# Patient Record
Sex: Male | Born: 1980 | Race: Black or African American | Hispanic: No | Marital: Married | Smoking: Never smoker
Health system: Southern US, Community
[De-identification: ages and names within clinical notes are randomized; demographics above are authoritative.]

---

## 2005-11-15 ENCOUNTER — Ambulatory Visit: Payer: Self-pay | Admitting: Internal Medicine

## 2006-02-15 ENCOUNTER — Ambulatory Visit: Payer: Self-pay | Admitting: Internal Medicine

## 2008-04-30 ENCOUNTER — Ambulatory Visit: Payer: Self-pay | Admitting: Internal Medicine

## 2008-04-30 LAB — CONVERTED CEMR LAB
Chlamydia, Swab/Urine, PCR: NEGATIVE
Ketones, urine, test strip: NEGATIVE
Nitrite: NEGATIVE
Urobilinogen, UA: NEGATIVE
WBC Urine, dipstick: NEGATIVE

## 2008-05-04 LAB — CONVERTED CEMR LAB
ALT: 46 units/L (ref 0–53)
AST: 32 units/L (ref 0–37)
BUN: 8 mg/dL (ref 6–23)
Basophils Absolute: 0 10*3/uL (ref 0.0–0.1)
Basophils Relative: 0.9 % (ref 0.0–3.0)
CO2: 31 meq/L (ref 19–32)
Calcium: 9.4 mg/dL (ref 8.4–10.5)
Chloride: 102 meq/L (ref 96–112)
Cholesterol: 224 mg/dL — ABNORMAL HIGH (ref 0–200)
Creatinine, Ser: 0.9 mg/dL (ref 0.4–1.5)
Direct LDL: 171.8 mg/dL
Eosinophils Absolute: 0.1 10*3/uL (ref 0.0–0.7)
Eosinophils Relative: 2.9 % (ref 0.0–5.0)
GFR calc non Af Amer: 129.64 mL/min (ref >60)
Glucose, Bld: 87 mg/dL (ref 70–99)
HCT: 44.4 % (ref 39.0–52.0)
HDL: 30.5 mg/dL — ABNORMAL LOW (ref >39.00)
Hemoglobin: 14.5 g/dL (ref 13.0–17.0)
Lymphocytes Relative: 51.6 % — ABNORMAL HIGH (ref 12.0–46.0)
Lymphs Abs: 1.5 10*3/uL (ref 0.7–4.0)
MCHC: 32.7 g/dL (ref 30.0–36.0)
MCV: 73 fL — ABNORMAL LOW (ref 78.0–100.0)
Monocytes Absolute: 0.4 10*3/uL (ref 0.1–1.0)
Monocytes Relative: 12.2 % — ABNORMAL HIGH (ref 3.0–12.0)
Neutro Abs: 1 10*3/uL — ABNORMAL LOW (ref 1.4–7.7)
Neutrophils Relative %: 32.4 % — ABNORMAL LOW (ref 43.0–77.0)
Platelets: 234 10*3/uL (ref 150.0–400.0)
Potassium: 4.3 meq/L (ref 3.5–5.1)
RBC: 6.09 M/uL — ABNORMAL HIGH (ref 4.22–5.81)
RDW: 13.8 % (ref 11.5–14.6)
Sodium: 139 meq/L (ref 135–145)
TSH: 2.27 microintl units/mL (ref 0.35–5.50)
Total CHOL/HDL Ratio: 7
Triglycerides: 119 mg/dL (ref 0.0–149.0)
VLDL: 23.8 mg/dL (ref 0.0–40.0)
WBC: 3 10*3/uL — ABNORMAL LOW (ref 4.5–10.5)

## 2008-05-05 ENCOUNTER — Telehealth: Payer: Self-pay | Admitting: Internal Medicine

## 2008-05-05 ENCOUNTER — Encounter (INDEPENDENT_AMBULATORY_CARE_PROVIDER_SITE_OTHER): Payer: Self-pay | Admitting: *Deleted

## 2008-05-06 ENCOUNTER — Encounter: Admission: RE | Admit: 2008-05-06 | Discharge: 2008-05-06 | Payer: Self-pay | Admitting: Internal Medicine

## 2008-06-12 ENCOUNTER — Telehealth (INDEPENDENT_AMBULATORY_CARE_PROVIDER_SITE_OTHER): Payer: Self-pay | Admitting: *Deleted

## 2008-06-18 ENCOUNTER — Encounter (INDEPENDENT_AMBULATORY_CARE_PROVIDER_SITE_OTHER): Payer: Self-pay | Admitting: *Deleted

## 2008-07-03 ENCOUNTER — Telehealth (INDEPENDENT_AMBULATORY_CARE_PROVIDER_SITE_OTHER): Payer: Self-pay | Admitting: *Deleted

## 2008-07-16 ENCOUNTER — Ambulatory Visit: Payer: Self-pay | Admitting: Internal Medicine

## 2008-08-07 ENCOUNTER — Telehealth (INDEPENDENT_AMBULATORY_CARE_PROVIDER_SITE_OTHER): Payer: Self-pay | Admitting: *Deleted

## 2008-08-08 ENCOUNTER — Ambulatory Visit: Payer: Self-pay | Admitting: Hematology & Oncology

## 2008-08-08 LAB — CONVERTED CEMR LAB
Basophils Absolute: 0 10*3/uL (ref 0.0–0.1)
Eosinophils Absolute: 0.1 10*3/uL (ref 0.0–0.7)
Lymphocytes Relative: 65.4 % — ABNORMAL HIGH (ref 12.0–46.0)
MCHC: 32.1 g/dL (ref 30.0–36.0)
Neutrophils Relative %: 20.5 % — ABNORMAL LOW (ref 43.0–77.0)
RDW: 13.7 % (ref 11.5–14.6)

## 2008-09-09 ENCOUNTER — Ambulatory Visit: Payer: Self-pay | Admitting: Hematology & Oncology

## 2008-09-10 ENCOUNTER — Encounter: Payer: Self-pay | Admitting: Internal Medicine

## 2008-09-10 LAB — CBC WITH DIFFERENTIAL (CANCER CENTER ONLY)
BASO%: 0.5 % (ref 0.0–2.0)
EOS%: 3.2 % (ref 0.0–7.0)
Eosinophils Absolute: 0.1 10*3/uL (ref 0.0–0.5)
LYMPH%: 58 % — ABNORMAL HIGH (ref 14.0–48.0)
MCH: 22.9 pg — ABNORMAL LOW (ref 28.0–33.4)
MONO%: 5.9 % (ref 0.0–13.0)
NEUT#: 0.9 10*3/uL — ABNORMAL LOW (ref 1.5–6.5)
Platelets: 222 10*3/uL (ref 145–400)
RBC: 5.86 10*6/uL — ABNORMAL HIGH (ref 4.20–5.70)
RDW: 13.9 % (ref 10.5–14.6)

## 2008-09-10 LAB — CHCC SATELLITE - SMEAR

## 2008-09-11 LAB — VITAMIN B12: Vitamin B-12: 249 pg/mL (ref 211–911)

## 2008-12-09 ENCOUNTER — Ambulatory Visit: Payer: Self-pay | Admitting: Hematology & Oncology

## 2008-12-10 ENCOUNTER — Encounter: Payer: Self-pay | Admitting: Internal Medicine

## 2008-12-10 LAB — CBC WITH DIFFERENTIAL (CANCER CENTER ONLY)
BASO#: 0 10*3/uL (ref 0.0–0.2)
EOS%: 3.2 % (ref 0.0–7.0)
HGB: 13.5 g/dL (ref 13.0–17.1)
LYMPH%: 56.9 % — ABNORMAL HIGH (ref 14.0–48.0)
MCH: 23.2 pg — ABNORMAL LOW (ref 28.0–33.4)
MCHC: 33.4 g/dL (ref 32.0–35.9)
MCV: 69 fL — ABNORMAL LOW (ref 82–98)
MONO%: 5.9 % (ref 0.0–13.0)
NEUT#: 0.9 10*3/uL — ABNORMAL LOW (ref 1.5–6.5)
Platelets: 248 10*3/uL (ref 145–400)

## 2009-06-15 ENCOUNTER — Ambulatory Visit: Payer: Self-pay | Admitting: Hematology & Oncology

## 2015-06-06 DIAGNOSIS — H2012 Chronic iridocyclitis, left eye: Secondary | ICD-10-CM | POA: Diagnosis not present

## 2015-07-22 ENCOUNTER — Emergency Department (HOSPITAL_COMMUNITY): Payer: BLUE CROSS/BLUE SHIELD

## 2015-07-22 ENCOUNTER — Encounter (HOSPITAL_COMMUNITY): Payer: Self-pay | Admitting: Family Medicine

## 2015-07-22 ENCOUNTER — Emergency Department (HOSPITAL_COMMUNITY)
Admission: EM | Admit: 2015-07-22 | Discharge: 2015-07-23 | Disposition: A | Discharge status: Home / Self Care | Payer: BLUE CROSS/BLUE SHIELD | Attending: Emergency Medicine | Admitting: Emergency Medicine

## 2015-07-22 DIAGNOSIS — D709 Neutropenia, unspecified: Secondary | ICD-10-CM | POA: Diagnosis not present

## 2015-07-22 DIAGNOSIS — I880 Nonspecific mesenteric lymphadenitis: Secondary | ICD-10-CM | POA: Diagnosis not present

## 2015-07-22 DIAGNOSIS — R1030 Lower abdominal pain, unspecified: Secondary | ICD-10-CM | POA: Diagnosis present

## 2015-07-22 DIAGNOSIS — M549 Dorsalgia, unspecified: Secondary | ICD-10-CM | POA: Diagnosis not present

## 2015-07-22 DIAGNOSIS — R109 Unspecified abdominal pain: Secondary | ICD-10-CM | POA: Diagnosis not present

## 2015-07-22 DIAGNOSIS — M5489 Other dorsalgia: Secondary | ICD-10-CM

## 2015-07-22 LAB — CBC WITH DIFFERENTIAL/PLATELET
BASOS ABS: 0 10*3/uL (ref 0.0–0.1)
Basophils Relative: 1 %
EOS ABS: 0.1 10*3/uL (ref 0.0–0.7)
Eosinophils Relative: 3 %
HEMATOCRIT: 44.7 % (ref 39.0–52.0)
HEMOGLOBIN: 14.1 g/dL (ref 13.0–17.0)
Lymphocytes Relative: 59 %
Lymphs Abs: 1.8 10*3/uL (ref 0.7–4.0)
MCH: 22.7 pg — ABNORMAL LOW (ref 26.0–34.0)
MCHC: 31.5 g/dL (ref 30.0–36.0)
MCV: 72 fL — ABNORMAL LOW (ref 78.0–100.0)
MONOS PCT: 8 %
Monocytes Absolute: 0.2 10*3/uL (ref 0.1–1.0)
NEUTROS PCT: 29 %
Neutro Abs: 0.8 10*3/uL — ABNORMAL LOW (ref 1.7–7.7)
Platelets: 211 10*3/uL (ref 150–400)
RBC: 6.21 MIL/uL — AB (ref 4.22–5.81)
RDW: 15.2 % (ref 11.5–15.5)
WBC: 2.9 10*3/uL — ABNORMAL LOW (ref 4.0–10.5)

## 2015-07-22 LAB — URINALYSIS, ROUTINE W REFLEX MICROSCOPIC
BILIRUBIN URINE: NEGATIVE
GLUCOSE, UA: NEGATIVE mg/dL
HGB URINE DIPSTICK: NEGATIVE
Ketones, ur: NEGATIVE mg/dL
Leukocytes, UA: NEGATIVE
Nitrite: NEGATIVE
PH: 6.5 (ref 5.0–8.0)
Protein, ur: NEGATIVE mg/dL
SPECIFIC GRAVITY, URINE: 1.018 (ref 1.005–1.030)

## 2015-07-22 LAB — BASIC METABOLIC PANEL
Anion gap: 6 (ref 5–15)
BUN: 6 mg/dL (ref 6–20)
CHLORIDE: 104 mmol/L (ref 101–111)
CO2: 25 mmol/L (ref 22–32)
CREATININE: 0.76 mg/dL (ref 0.61–1.24)
Calcium: 9.4 mg/dL (ref 8.9–10.3)
GFR calc Af Amer: >60 mL/min (ref >60)
GFR calc non Af Amer: >60 mL/min (ref >60)
GLUCOSE: 93 mg/dL (ref 65–99)
Potassium: 3.8 mmol/L (ref 3.5–5.1)
Sodium: 135 mmol/L (ref 135–145)

## 2015-07-22 MED ORDER — CYCLOBENZAPRINE HCL 10 MG PO TABS: 10.0000 mg | ORAL_TABLET | Freq: Three times a day (TID) | ORAL | Status: AC | PRN

## 2015-07-22 MED ORDER — IBUPROFEN 200 MG PO TABS
600.0000 mg | ORAL_TABLET | Freq: Once | ORAL | Status: AC
  Administered 2015-07-22: 600 mg via ORAL
  Filled 2015-07-22: qty 3

## 2015-07-22 MED ORDER — NAPROXEN 500 MG PO TABS: 500.0000 mg | ORAL_TABLET | Freq: Two times a day (BID) | ORAL | Status: AC

## 2015-07-22 NOTE — ED Notes (Signed)
Per pt about 2 saturdays ago he was running down a hill and pulled muscle in lower back. sts then the pain began to radiate into groin and testicles. sts sharp pain.

## 2015-07-22 NOTE — Discharge Instructions (Signed)
Follow up with your primary care doctor. You will need a CT scan in 3-6 months to make sure the lymph node swelling goes down.

## 2015-07-22 NOTE — ED Provider Notes (Signed)
CSN: [id number]     Arrival date & time 07/22/15  0719 History   First MD Initiated Contact with Patient 07/22/15 [phone]     Chief Complaint  Patient presents with  . Groin Pain     (Consider location/radiation/quality/duration/timing/severity/associated sxs/prior Treatment) HPI  35 year old male with right-sided flank pain for the past 10 days. He states his started the day after he ran down a hill faster than he wanted chasing a mask while. He thought he possibly pulled a muscle in his back. Has had intermittent pain since. Occasionally the pain shoots into his right testicle. Denies hematuria or dysuria. Has tried different stretching of exercises but no pain medicine. However this morning the pain was much more severe. Currently at rest it is about a 5/10. Certain movements make it worse. No abdominal pain, nausea or vomiting.  History reviewed. No pertinent past medical history. History reviewed. No pertinent past surgical history. History reviewed. No pertinent family history. Social History  Substance Use Topics  . Smoking status: Never Smoker   . Smokeless tobacco: None  . Alcohol Use: None    Review of Systems  Constitutional: Negative for fever.  Gastrointestinal: Negative for nausea, vomiting, abdominal pain and diarrhea.  Genitourinary: Negative for dysuria and hematuria.  Musculoskeletal: Positive for back pain.  All other systems reviewed and are negative.     Allergies  Review of patient's allergies indicates no known allergies.  Home Medications   Prior to Admission medications   Not on File   BP 134/94 mmHg  Pulse 69  Temp(Src) 98.2 F (36.8 C) (Oral)  Resp 16  SpO2 98% Physical Exam  Constitutional: He is oriented to person, place, and time. He appears well-developed and well-nourished.  HENT:  Head: Normocephalic and atraumatic.  Right Ear: External ear normal.  Left Ear: External ear normal.  Nose: Nose normal.  Eyes: Right eye exhibits no  discharge. Left eye exhibits no discharge.  Neck: Neck supple.  Cardiovascular: Normal rate, regular rhythm, normal heart sounds and intact distal pulses.   Pulmonary/Chest: Effort normal and breath sounds normal.  Abdominal: Soft. He exhibits no distension. There is no tenderness. Hernia confirmed negative in the right inguinal area.  No flank tenderness or CVA tenderness  Genitourinary: Penis normal. Right testis shows no mass, no swelling and no tenderness. Left testis shows no mass, no swelling and no tenderness.  Musculoskeletal: He exhibits no edema.  No thoracolumbar tenderness  Neurological: He is alert and oriented to person, place, and time.  Skin: Skin is warm and dry.  Nursing note and vitals reviewed.   ED Course  Procedures (including critical care time) Labs Review Labs Reviewed  CBC WITH DIFFERENTIAL/PLATELET - Abnormal; Notable for the following:    WBC 2.9 (*)    RBC 6.21 (*)    MCV 72.0 (*)    MCH 22.7 (*)    Neutro Abs 0.8 (*)    All other components within normal limits  URINALYSIS, ROUTINE W REFLEX MICROSCOPIC (NOT AT [facility])  BASIC METABOLIC PANEL    Imaging Review Ct Renal Stone Study  07/22/2015  CLINICAL DATA:  Right flank pain for 2 weeks. EXAM: CT ABDOMEN AND PELVIS WITHOUT CONTRAST TECHNIQUE: Multidetector CT imaging of the abdomen and pelvis was performed following the standard protocol without IV contrast. COMPARISON:  None. FINDINGS: Visualized lung bases are unremarkable. No significant osseous abnormality is noted. No gallstones are noted. No focal abnormality is noted in the liver, spleen or pancreas on these unenhanced images.  Adrenal glands and kidneys appear normal. No hydronephrosis or renal obstruction is noted. No renal or ureteral calculi are noted. Moderate size fat containing periumbilical hernia is noted. There is no evidence of bowel obstruction. No abnormal fluid collection is noted. Urinary bladder appears normal. Mildly enlarged mesenteric  lymph nodes are noted in the right lower quadrant, with the largest measuring 2.5 x 1.9 x 1.3 cm. IMPRESSION: No hydronephrosis or renal obstruction is noted. No renal or ureteral calculi are noted. Moderate size fat containing periumbilical hernia is noted. Mildly enlarged mesenteric lymph nodes are noted in the right lower quadrant, with the largest lymph node measuring 1.3 cm in minor axis. This may simply represent mesenteric adenitis, but correlation with clinical and laboratory data is recommended to evaluate for possible lymphoproliferative disorder. Electronically Signed   By: Lupita Raider, M.D.   On: 07/22/2015 09:38   I have personally reviewed and evaluated these images and lab results as part of my medical decision-making.   EKG Interpretation None      MDM   Final diagnoses:  Right-sided back pain, unspecified location  Mesenteric adenitis    Unclear etiology for patient's right flank/back pain. Pain does sometimes radiate into his testicles but is GU exam is normal. He denies any discharge or dysuria. Swelling or tenderness. CT shows no obvious renal stone. That show enlarged lymph nodes, WBC is low but is similar to 7 years ago. Discussed CBC with Dr. Bertis Ruddy who says most likely reason is a normal variant due to patient being African-American. Patient states he was told this by another oncologist as well. He will need follow-up with his PCP for repeat imaging per oncology to make sure the lymph nodes improved. Otherwise likely he has muscular pain. Treat with NSAIDs and muscle relaxers. Discussed return precautions and need for follow-up.    Pricilla Loveless, MD 07/22/15 1153

## 2015-07-22 NOTE — ED Notes (Signed)
Pt ambulated to room from waiting room. 

## 2015-07-23 LAB — PATHOLOGIST SMEAR REVIEW

## 2015-08-03 DIAGNOSIS — M545 Low back pain: Secondary | ICD-10-CM | POA: Diagnosis not present

## 2015-08-14 DIAGNOSIS — N390 Urinary tract infection, site not specified: Secondary | ICD-10-CM | POA: Diagnosis not present

## 2017-03-30 DIAGNOSIS — D649 Anemia, unspecified: Secondary | ICD-10-CM | POA: Diagnosis not present

## 2017-03-30 DIAGNOSIS — Z13220 Encounter for screening for lipoid disorders: Secondary | ICD-10-CM | POA: Diagnosis not present

## 2017-03-30 DIAGNOSIS — Z0000 Encounter for general adult medical examination without abnormal findings: Secondary | ICD-10-CM | POA: Diagnosis not present

## 2017-03-30 DIAGNOSIS — Z23 Encounter for immunization: Secondary | ICD-10-CM | POA: Diagnosis not present

## 2017-07-27 DIAGNOSIS — E611 Iron deficiency: Secondary | ICD-10-CM | POA: Diagnosis not present

## 2017-12-04 IMAGING — CT CT RENAL STONE PROTOCOL
2 of 3 series · 11 of 46 positions shown, 12 images · non-contrast
Comparison: None.

CLINICAL DATA: Right flank pain for 2 weeks.

EXAM:
CT ABDOMEN AND PELVIS WITHOUT CONTRAST
TECHNIQUE: Multidetector CT imaging of the abdomen and pelvis was performed
following the standard protocol without IV contrast.

[Series 201: stone study, idose (2) · axial · 0.71mm/px · z∈[+307,+727]mm · 8 of 98 slices shown, 9 images]
[im 7/98  soft-tissue]
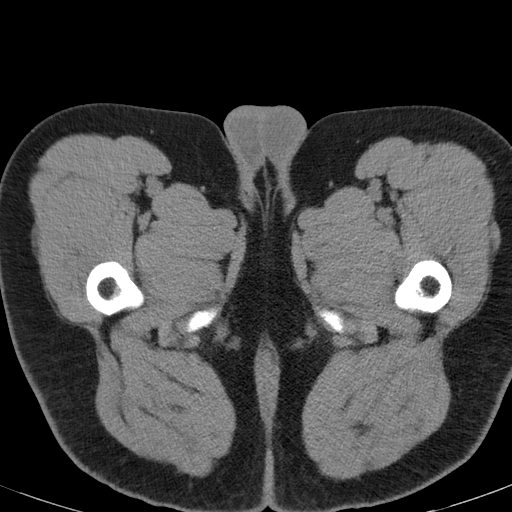
[im 7/98  bone]
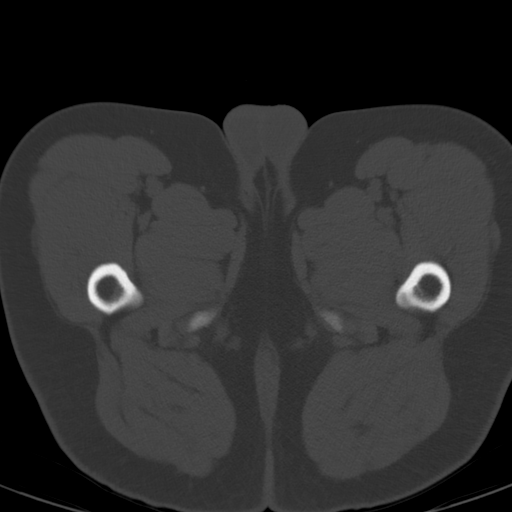
[im 19/98  soft-tissue]
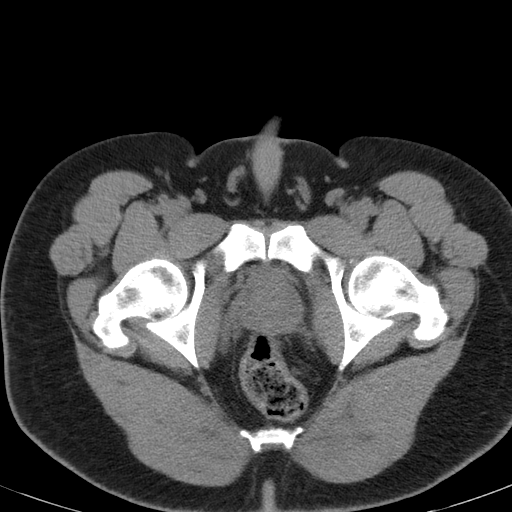
[im 32/98  soft-tissue]
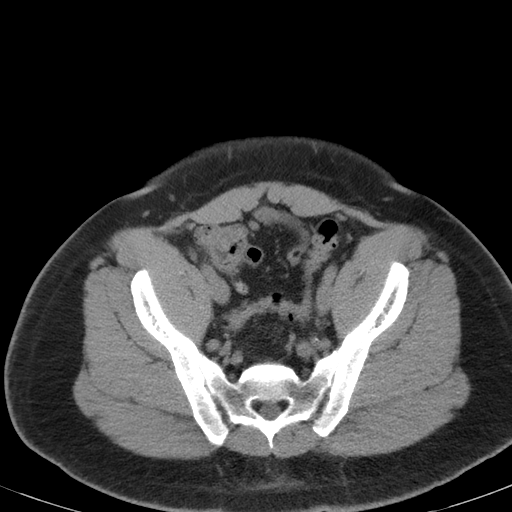
[im 44/98  soft-tissue]
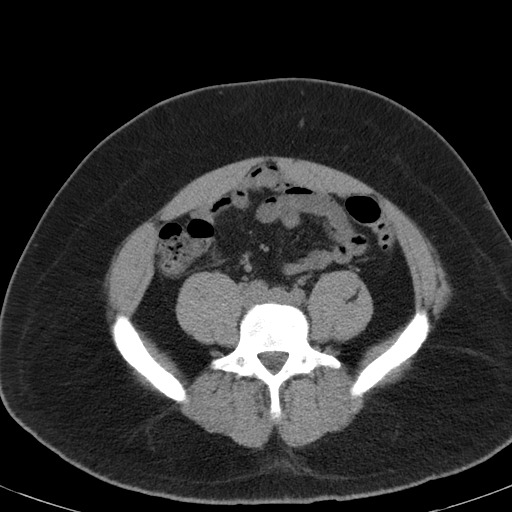
[im 54/98  soft-tissue]
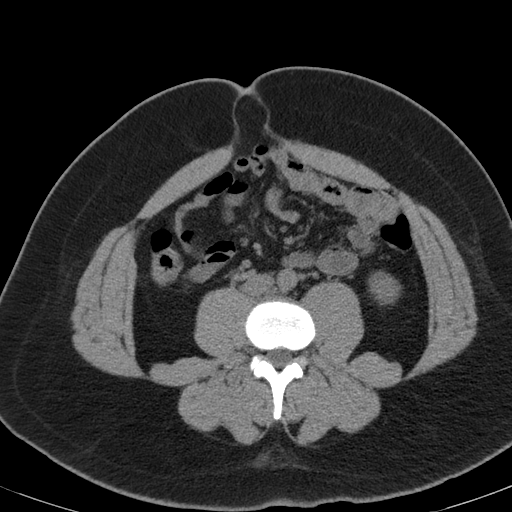
[im 66/98  soft-tissue]
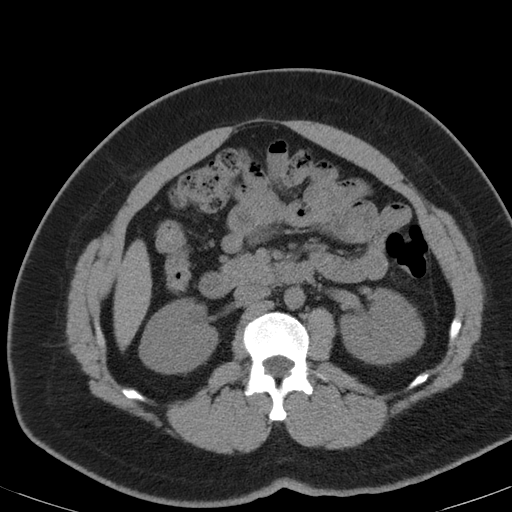
[im 79/98  soft-tissue]
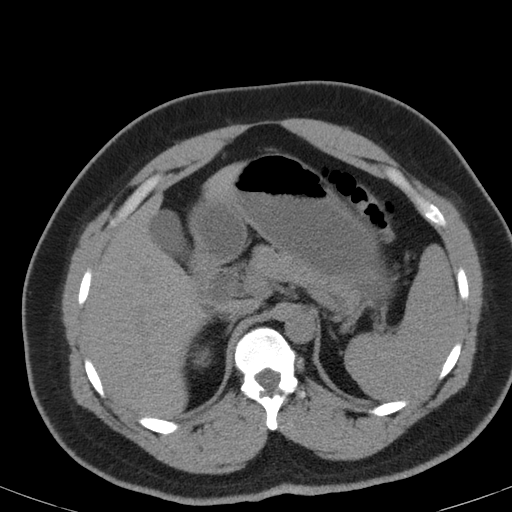
[im 91/98  soft-tissue]
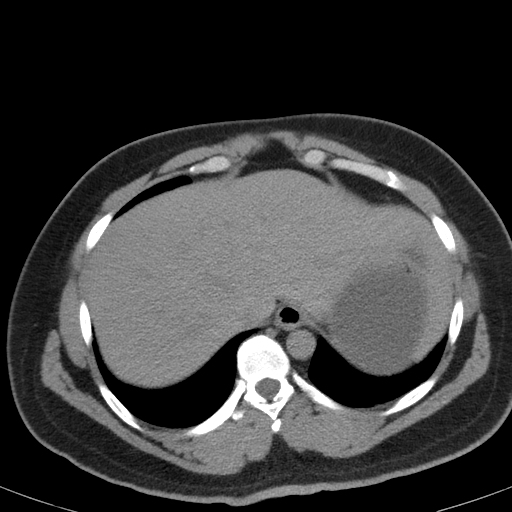

[Series 203: coronals, idose (2) · coronal · 0.45mm/px · 3 of 130 slices shown]
[im 44/130  soft-tissue]
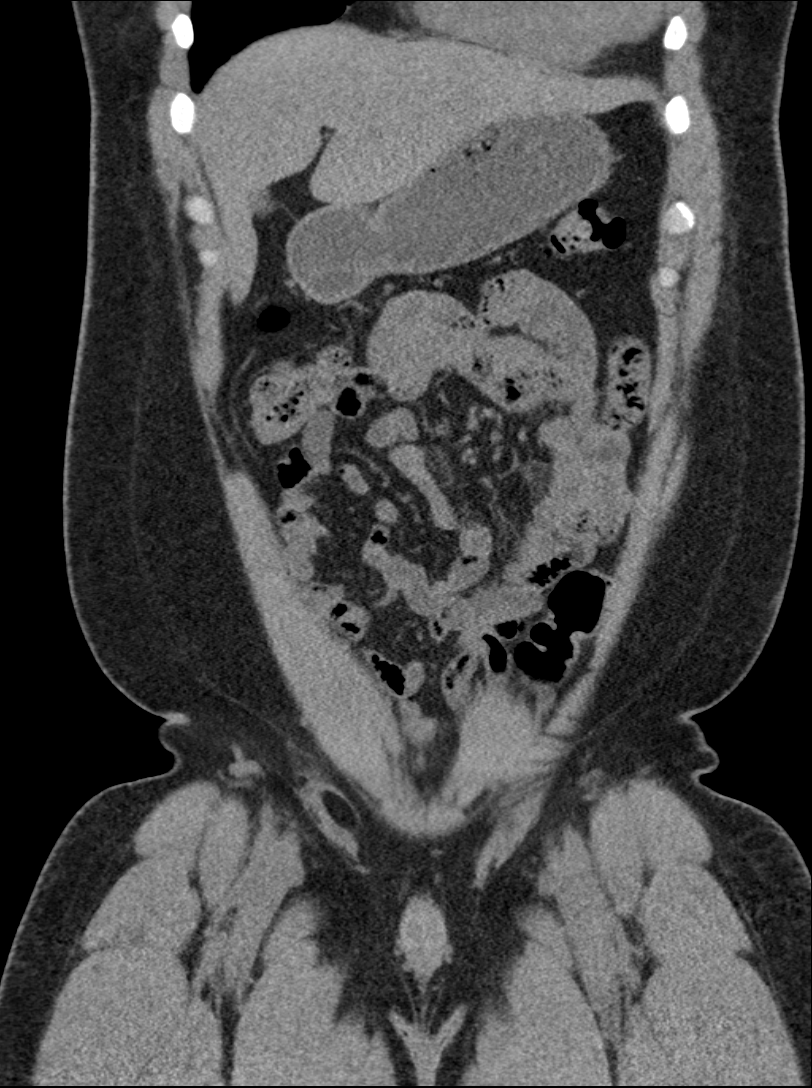
[im 58/130  soft-tissue]
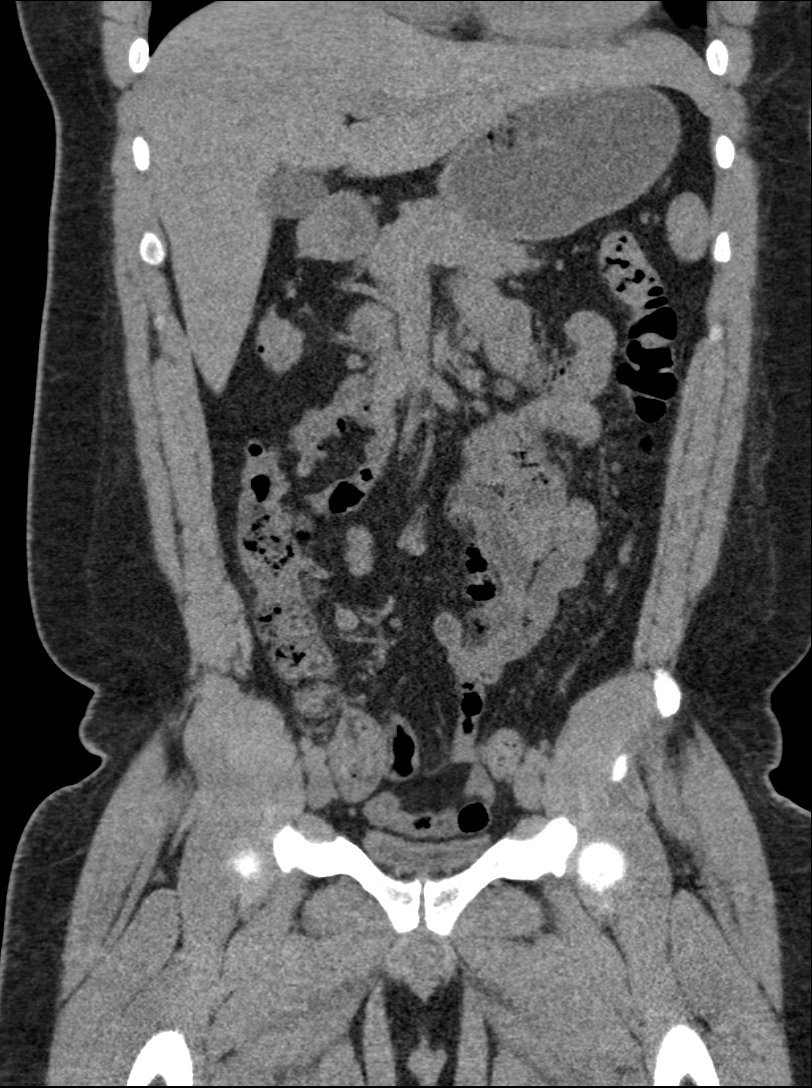
[im 72/130  soft-tissue]
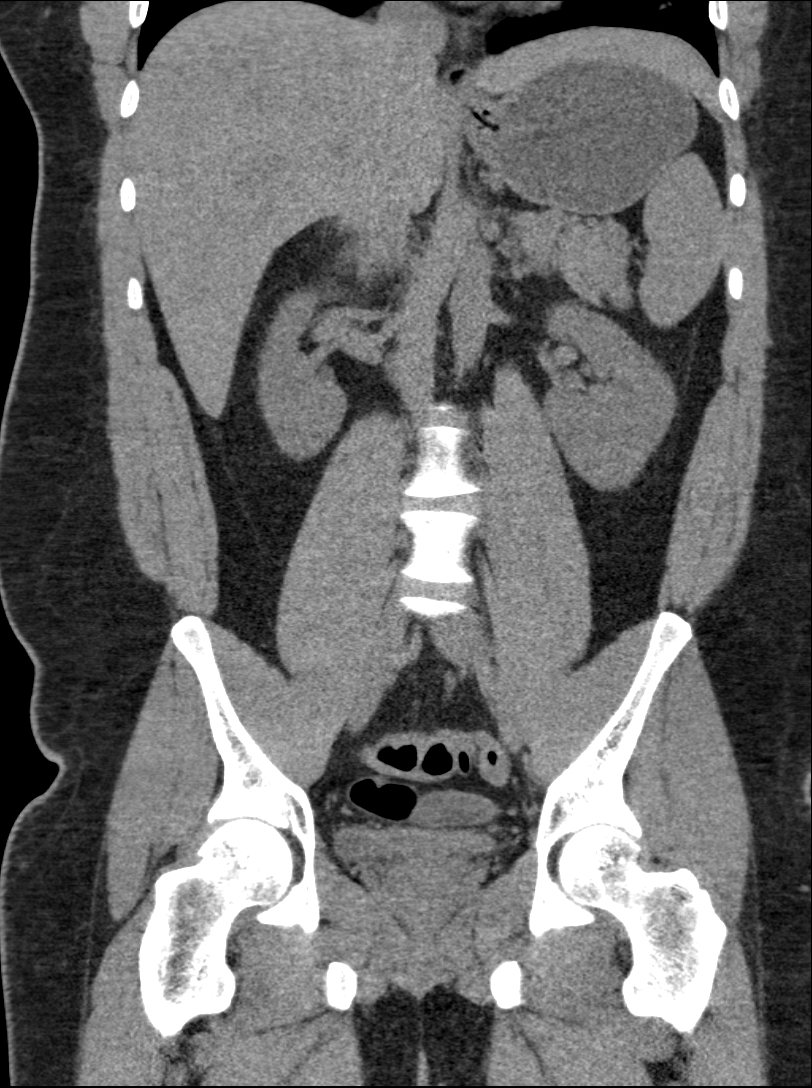

[11 of 46 positions shown; findings below may reference images not displayed]

FINDINGS: Visualized lung bases are unremarkable. No significant osseous
abnormality is noted.

No gallstones are noted. No focal abnormality is noted in the liver,
spleen or pancreas on these unenhanced images. Adrenal glands and
kidneys appear normal. No hydronephrosis or renal obstruction is
noted. No renal or ureteral calculi are noted. Moderate size fat
containing periumbilical hernia is noted. There is no evidence of
bowel obstruction. No abnormal fluid collection is noted. Urinary
bladder appears normal. Mildly enlarged mesenteric lymph nodes are
noted in the right lower quadrant, with the largest measuring 2.5 x
1.9 x 1.3 cm.
IMPRESSION: No hydronephrosis or renal obstruction is noted. No renal or
ureteral calculi are noted.

Moderate size fat containing periumbilical hernia is noted.

Mildly enlarged mesenteric lymph nodes are noted in the right lower
quadrant, with the largest lymph node measuring 1.3 cm in minor
axis. This may simply represent mesenteric adenitis, but correlation
with clinical and laboratory data is recommended to evaluate for
possible lymphoproliferative disorder.

## 2018-04-03 DIAGNOSIS — R079 Chest pain, unspecified: Secondary | ICD-10-CM | POA: Diagnosis not present

## 2018-04-03 DIAGNOSIS — Z0000 Encounter for general adult medical examination without abnormal findings: Secondary | ICD-10-CM | POA: Diagnosis not present

## 2018-04-03 DIAGNOSIS — Z8249 Family history of ischemic heart disease and other diseases of the circulatory system: Secondary | ICD-10-CM | POA: Diagnosis not present

## 2018-04-03 DIAGNOSIS — D72819 Decreased white blood cell count, unspecified: Secondary | ICD-10-CM | POA: Diagnosis not present

## 2018-04-03 DIAGNOSIS — D649 Anemia, unspecified: Secondary | ICD-10-CM | POA: Diagnosis not present

## 2018-04-03 DIAGNOSIS — Z13220 Encounter for screening for lipoid disorders: Secondary | ICD-10-CM | POA: Diagnosis not present

## 2018-04-05 ENCOUNTER — Telehealth: Payer: Self-pay | Admitting: Hematology & Oncology

## 2018-04-05 NOTE — Telephone Encounter (Signed)
Printed and faxed medical record from [facility] sent to [facility] [place] [phone]

## 2018-12-14 DIAGNOSIS — L723 Sebaceous cyst: Secondary | ICD-10-CM | POA: Diagnosis not present

## 2019-02-15 ENCOUNTER — Emergency Department (HOSPITAL_COMMUNITY): Payer: BC Managed Care – PPO

## 2019-02-15 ENCOUNTER — Encounter (HOSPITAL_COMMUNITY): Payer: Self-pay | Admitting: Emergency Medicine

## 2019-02-15 ENCOUNTER — Emergency Department (HOSPITAL_COMMUNITY)
Admission: EM | Admit: 2019-02-15 | Discharge: 2019-02-15 | Disposition: A | Discharge status: Home / Self Care | Payer: BC Managed Care – PPO | Attending: Emergency Medicine | Admitting: Emergency Medicine

## 2019-02-15 ENCOUNTER — Other Ambulatory Visit: Payer: Self-pay

## 2019-02-15 DIAGNOSIS — R079 Chest pain, unspecified: Secondary | ICD-10-CM | POA: Diagnosis not present

## 2019-02-15 DIAGNOSIS — Z79899 Other long term (current) drug therapy: Secondary | ICD-10-CM | POA: Insufficient documentation

## 2019-02-15 DIAGNOSIS — R030 Elevated blood-pressure reading, without diagnosis of hypertension: Secondary | ICD-10-CM

## 2019-02-15 LAB — BASIC METABOLIC PANEL
Anion gap: 10 (ref 5–15)
BUN: 10 mg/dL (ref 6–20)
CO2: 23 mmol/L (ref 22–32)
Calcium: 9.2 mg/dL (ref 8.9–10.3)
Chloride: 102 mmol/L (ref 98–111)
Creatinine, Ser: 0.95 mg/dL (ref 0.61–1.24)
GFR calc Af Amer: >60 mL/min (ref >60)
GFR calc non Af Amer: >60 mL/min (ref >60)
Glucose, Bld: 101 mg/dL — ABNORMAL HIGH (ref 70–99)
Potassium: 3.6 mmol/L (ref 3.5–5.1)
Sodium: 135 mmol/L (ref 135–145)

## 2019-02-15 LAB — CBC
HCT: 44.8 % (ref 39.0–52.0)
Hemoglobin: 13.5 g/dL (ref 13.0–17.0)
MCH: 22.7 pg — ABNORMAL LOW (ref 26.0–34.0)
MCHC: 30.1 g/dL (ref 30.0–36.0)
MCV: 75.2 fL — ABNORMAL LOW (ref 80.0–100.0)
Platelets: 279 10*3/uL (ref 150–400)
RBC: 5.96 MIL/uL — ABNORMAL HIGH (ref 4.22–5.81)
RDW: 15.9 % — ABNORMAL HIGH (ref 11.5–15.5)
WBC: 3.8 10*3/uL — ABNORMAL LOW (ref 4.0–10.5)
nRBC: 0 % (ref 0.0–0.2)

## 2019-02-15 LAB — PROTIME-INR
INR: 1 (ref 0.8–1.2)
Prothrombin Time: 13.1 seconds (ref 11.4–15.2)

## 2019-02-15 LAB — TROPONIN I (HIGH SENSITIVITY)
Troponin I (High Sensitivity): 3 ng/L (ref <18)
Troponin I (High Sensitivity): 3 ng/L (ref <18)

## 2019-02-15 MED ORDER — SODIUM CHLORIDE 0.9% FLUSH: 3.0000 mL | Freq: Once | INTRAVENOUS | Status: DC

## 2019-02-15 NOTE — ED Provider Notes (Signed)
[place] EMERGENCY DEPARTMENT Provider Note   CSN: [id number] Arrival date & time: 02/15/19  0025   History Chief Complaint  Patient presents with  . Chest Pain    Allen Riley is a 39 y.o. male.  The history is provided by the patient.  Chest Pain He had onset about 4 PM with a tense feeling in the left side of his chest.  It seemed to be worse if he moves in certain ways.  It was mild and he rates his discomfort at 4/10.  There is no associated dyspnea, nausea, diaphoresis.  The tense feeling is gradually subsided and is now gone.  He is a non-smoker and denies history of hypertension or diabetes or hyperlipidemia.  He does have family history of premature coronary atherosclerosis.  History reviewed. No pertinent past medical history.  There are no problems to display for this patient.   History reviewed. No pertinent surgical history.     No family history on file.  Social History   Tobacco Use  . Smoking status: Never Smoker  . Smokeless tobacco: Never Used  Substance Use Topics  . Alcohol use: Never  . Drug use: Never    Home Medications Prior to Admission medications   Medication Sig Start Date End Date Taking? Authorizing Provider  cyclobenzaprine (FLEXERIL) 10 MG tablet Take 1 tablet (10 mg total) by mouth 3 (three) times daily as needed for muscle spasms. 07/22/15   Sherwood Gambler, MD  naproxen (NAPROSYN) 500 MG tablet Take 1 tablet (500 mg total) by mouth 2 (two) times daily with a meal. 07/22/15   Sherwood Gambler, MD    Allergies    Patient has no known allergies.  Review of Systems   Review of Systems  Cardiovascular: Positive for chest pain.  All other systems reviewed and are negative.   Physical Exam Updated Vital Signs BP 123/81   Pulse 67   Temp 98.2 F (36.8 C) (Oral)   Resp 16   SpO2 96%   Physical Exam Vitals and nursing note reviewed.   39 year old male, resting comfortably and in no acute distress. Vital  signs are normal. Oxygen saturation is 96%, which is normal. Head is normocephalic and atraumatic. PERRLA, EOMI. Oropharynx is clear. Neck is nontender and supple without adenopathy or JVD. Back is nontender and there is no CVA tenderness. Lungs are clear without rales, wheezes, or rhonchi. Chest is nontender. Heart has regular rate and rhythm without murmur. Abdomen is soft, flat, nontender without masses or hepatosplenomegaly and peristalsis is normoactive. Extremities have no cyanosis or edema, full range of motion is present. Skin is warm and dry without rash. Neurologic: Mental status is normal, cranial nerves are intact, there are no motor or sensory deficits.  ED Results / Procedures / Treatments   Labs (all labs ordered are listed, but only abnormal results are displayed) Labs Reviewed  BASIC METABOLIC PANEL - Abnormal; Notable for the following components:      Result Value   Glucose, Bld 101 (*)    All other components within normal limits  CBC - Abnormal; Notable for the following components:   WBC 3.8 (*)    RBC 5.96 (*)    MCV 75.2 (*)    MCH 22.7 (*)    RDW 15.9 (*)    All other components within normal limits  PROTIME-INR  TROPONIN I (HIGH SENSITIVITY)  TROPONIN I (HIGH SENSITIVITY)    EKG EKG Interpretation  Date/Time:  Friday February 15 2019 00:32:39 EST Ventricular Rate:  89 PR Interval:  150 QRS Duration: 88 QT Interval:  362 QTC Calculation: 440 R Axis:   76 Text Interpretation: Normal sinus rhythm Normal ECG No old tracing to compare Confirmed by Dione Booze ([id number]) on 02/15/2019 2:16:05 AM   Radiology DG Chest 2 View  Result Date: 02/15/2019 CLINICAL DATA:  Chest pain, no shortness of breath. EXAM: CHEST - 2 VIEW COMPARISON:  None. FINDINGS: No consolidation, features of edema, pneumothorax, or effusion. Pulmonary vascularity is normally distributed. The cardiomediastinal contours are unremarkable. No acute osseous or soft tissue abnormality.  IMPRESSION: No acute cardiopulmonary abnormality. Electronically Signed   By: Kreg Shropshire M.D.   On: 02/15/2019 01:03    Procedures Procedures  Medications Ordered in ED Medications  sodium chloride flush (NS) 0.9 % injection 3 mL (has no administration in time range)    ED Course  I have reviewed the triage vital signs and the nursing notes.  Pertinent labs & imaging results that were available during my care of the patient were reviewed by me and considered in my medical decision making (see chart for details).  MDM Rules/Calculators/A&P Chest pain which is somewhat atypical.  ECG is normal and troponin is normal x2.  Chest x-ray is normal.  Blood pressures have been generally elevated, this will need to be followed as an outpatient.  Old records are reviewed, and he has no relevant prior visits.  He is reassured that his chest pain does not appear to be cardiac, but recommended he follow-up with his PCP regarding his blood pressure.  Return precautions discussed.  Final Clinical Impression(s) / ED Diagnoses Final diagnoses:  Nonspecific chest pain  Elevated blood pressure reading without diagnosis of hypertension    Rx / DC Orders ED Discharge Orders    None       Dione Booze, MD 02/15/19 [phone]

## 2019-02-15 NOTE — Discharge Instructions (Signed)
Take acetaminophen, ibuprofen, or naproxen as needed for pain.  Your blood pressure was a little high today.  Please have it checked several times over the next 2 weeks.  If it stays consistently high, you may need to be on medication to control it.

## 2019-02-15 NOTE — ED Triage Notes (Signed)
Patient reports left chest pain onset yesterday afternoon , denies SOB , no fever or chills , no emesis or diaphoresis.

## 2019-07-19 DIAGNOSIS — Z13220 Encounter for screening for lipoid disorders: Secondary | ICD-10-CM | POA: Diagnosis not present

## 2019-07-19 DIAGNOSIS — Z0000 Encounter for general adult medical examination without abnormal findings: Secondary | ICD-10-CM | POA: Diagnosis not present

## 2020-05-11 DIAGNOSIS — H20021 Recurrent acute iridocyclitis, right eye: Secondary | ICD-10-CM | POA: Diagnosis not present

## 2020-05-28 DIAGNOSIS — H2011 Chronic iridocyclitis, right eye: Secondary | ICD-10-CM | POA: Diagnosis not present

## 2020-06-10 DIAGNOSIS — H2011 Chronic iridocyclitis, right eye: Secondary | ICD-10-CM | POA: Diagnosis not present

## 2020-07-08 DIAGNOSIS — H2011 Chronic iridocyclitis, right eye: Secondary | ICD-10-CM | POA: Diagnosis not present

## 2020-07-29 DIAGNOSIS — D72819 Decreased white blood cell count, unspecified: Secondary | ICD-10-CM | POA: Diagnosis not present

## 2020-07-29 DIAGNOSIS — Z13220 Encounter for screening for lipoid disorders: Secondary | ICD-10-CM | POA: Diagnosis not present

## 2020-07-29 DIAGNOSIS — Z0000 Encounter for general adult medical examination without abnormal findings: Secondary | ICD-10-CM | POA: Diagnosis not present

## 2020-08-12 DIAGNOSIS — H2011 Chronic iridocyclitis, right eye: Secondary | ICD-10-CM | POA: Diagnosis not present

## 2020-12-04 DIAGNOSIS — M545 Low back pain, unspecified: Secondary | ICD-10-CM | POA: Diagnosis not present

## 2020-12-04 DIAGNOSIS — M79604 Pain in right leg: Secondary | ICD-10-CM | POA: Diagnosis not present

## 2021-09-13 DIAGNOSIS — Z0000 Encounter for general adult medical examination without abnormal findings: Secondary | ICD-10-CM | POA: Diagnosis not present

## 2021-09-13 DIAGNOSIS — Z130 Encounter for screening for diseases of the blood and blood-forming organs and certain disorders involving the immune mechanism: Secondary | ICD-10-CM | POA: Diagnosis not present

## 2022-09-22 DIAGNOSIS — Z125 Encounter for screening for malignant neoplasm of prostate: Secondary | ICD-10-CM | POA: Diagnosis not present

## 2022-09-22 DIAGNOSIS — Z13220 Encounter for screening for lipoid disorders: Secondary | ICD-10-CM | POA: Diagnosis not present

## 2022-09-22 DIAGNOSIS — Z0000 Encounter for general adult medical examination without abnormal findings: Secondary | ICD-10-CM | POA: Diagnosis not present

## 2022-09-22 DIAGNOSIS — I10 Essential (primary) hypertension: Secondary | ICD-10-CM | POA: Diagnosis not present

## 2022-09-22 DIAGNOSIS — Z131 Encounter for screening for diabetes mellitus: Secondary | ICD-10-CM | POA: Diagnosis not present

## 2022-12-23 DIAGNOSIS — E038 Other specified hypothyroidism: Secondary | ICD-10-CM | POA: Diagnosis not present

## 2022-12-23 DIAGNOSIS — E119 Type 2 diabetes mellitus without complications: Secondary | ICD-10-CM | POA: Diagnosis not present

## 2023-06-30 DIAGNOSIS — E119 Type 2 diabetes mellitus without complications: Secondary | ICD-10-CM | POA: Diagnosis not present

## 2023-06-30 DIAGNOSIS — I10 Essential (primary) hypertension: Secondary | ICD-10-CM | POA: Diagnosis not present

## 2023-08-21 DIAGNOSIS — Z6836 Body mass index (BMI) 36.0-36.9, adult: Secondary | ICD-10-CM | POA: Diagnosis not present

## 2023-08-21 DIAGNOSIS — E119 Type 2 diabetes mellitus without complications: Secondary | ICD-10-CM | POA: Diagnosis not present

## 2023-11-10 DIAGNOSIS — E119 Type 2 diabetes mellitus without complications: Secondary | ICD-10-CM | POA: Diagnosis not present

## 2024-01-02 DIAGNOSIS — Z1329 Encounter for screening for other suspected endocrine disorder: Secondary | ICD-10-CM | POA: Diagnosis not present

## 2024-01-02 DIAGNOSIS — Z0000 Encounter for general adult medical examination without abnormal findings: Secondary | ICD-10-CM | POA: Diagnosis not present

## 2024-01-02 DIAGNOSIS — E119 Type 2 diabetes mellitus without complications: Secondary | ICD-10-CM | POA: Diagnosis not present

## 2024-02-05 DIAGNOSIS — E7800 Pure hypercholesterolemia, unspecified: Secondary | ICD-10-CM | POA: Diagnosis not present
